# Patient Record
Sex: Female | Born: 1975 | Race: White | Hispanic: No | Marital: Married | Smoking: Former smoker
Health system: Southern US, Community
[De-identification: ages and names within clinical notes are randomized; demographics above are authoritative.]

## PROBLEM LIST (undated history)

## (undated) DIAGNOSIS — G709 Myoneural disorder, unspecified: Secondary | ICD-10-CM

## (undated) HISTORY — PX: OTHER SURGICAL HISTORY: SHX169

---

## 2009-06-05 ENCOUNTER — Emergency Department (HOSPITAL_COMMUNITY): Admission: EM | Admit: 2009-06-05 | Discharge: 2009-06-05 | Payer: Self-pay | Admitting: Emergency Medicine

## 2009-07-29 ENCOUNTER — Emergency Department (HOSPITAL_COMMUNITY): Admission: EM | Admit: 2009-07-29 | Discharge: 2009-07-29 | Payer: Self-pay | Admitting: Emergency Medicine

## 2012-08-10 ENCOUNTER — Inpatient Hospital Stay (HOSPITAL_COMMUNITY)
Admission: AD | Admit: 2012-08-10 | Discharge: 2012-08-10 | Disposition: A | Discharge status: Home / Self Care | Payer: BC Managed Care – PPO | Source: Ambulatory Visit | Attending: Obstetrics and Gynecology | Admitting: Obstetrics and Gynecology

## 2012-08-10 ENCOUNTER — Encounter (HOSPITAL_COMMUNITY): Payer: Self-pay

## 2012-08-10 ENCOUNTER — Inpatient Hospital Stay (HOSPITAL_COMMUNITY): Payer: BC Managed Care – PPO

## 2012-08-10 DIAGNOSIS — F4310 Post-traumatic stress disorder, unspecified: Secondary | ICD-10-CM | POA: Diagnosis not present

## 2012-08-10 DIAGNOSIS — O209 Hemorrhage in early pregnancy, unspecified: Secondary | ICD-10-CM | POA: Diagnosis present

## 2012-08-10 DIAGNOSIS — IMO0002 Reserved for concepts with insufficient information to code with codable children: Secondary | ICD-10-CM | POA: Diagnosis present

## 2012-08-10 DIAGNOSIS — Z8659 Personal history of other mental and behavioral disorders: Secondary | ICD-10-CM

## 2012-08-10 DIAGNOSIS — O021 Missed abortion: Secondary | ICD-10-CM | POA: Insufficient documentation

## 2012-08-10 NOTE — MAU Provider Note (Signed)
History    37 yo G1P0 at 10 weeks by early [place] here for f/u US after calling office with c/o brown spotting and mild cramping. Known O+ type.  Negative cultures 07/13/12.  US--8 2/7 week IUFD  Findings reviewed with patient.  Options reviewed--observation, [employer], or D&E. Patient wishes to be scheduled later this week when partner is back in town. I will inform office of request and have them coordinate arrangements with patient. Support to patient for loss. Consulted with Dr. Normand Sloop.   Nigel Bridgeman CNM, [place] 08/10/2012 2:54 PM

## 2012-08-10 NOTE — MAU Note (Signed)
Patient states she started having lower abdominal cramping and brownish spotting this am. Cramping continues.

## 2012-08-10 NOTE — Discharge Instructions (Signed)
Miscarriage A miscarriage is the sudden loss of an unborn baby (fetus) before the 20th week of pregnancy. Most miscarriages happen in the first 3 months of pregnancy. Sometimes, it happens before a woman even knows she is pregnant. A miscarriage is also called a "spontaneous miscarriage" or "early pregnancy loss." Having a miscarriage can be an emotional experience. Talk with your caregiver about any questions you may have about miscarrying, the grieving process, and your future pregnancy plans. CAUSES   Problems with the fetal chromosomes that make it impossible for the baby to develop normally. Problems with the baby's genes or chromosomes are most often the result of errors that occur, by chance, as the embryo divides and grows. The problems are not inherited from the parents.  Infection of the cervix or uterus.   Hormone problems.   Problems with the cervix, such as having an incompetent cervix. This is when the tissue in the cervix is not strong enough to hold the pregnancy.   Problems with the uterus, such as an abnormally shaped uterus, uterine fibroids, or congenital abnormalities.   Certain medical conditions.   Smoking, drinking alcohol, or taking illegal drugs.   Trauma.  Often, the cause of a miscarriage is unknown.  SYMPTOMS   Vaginal bleeding or spotting, with or without cramps or pain.  Pain or cramping in the abdomen or lower back.  Passing fluid, tissue, or blood clots from the vagina. DIAGNOSIS  Your caregiver will perform a physical exam. You may also have an ultrasound to confirm the miscarriage. Blood or urine tests may also be ordered. TREATMENT   Sometimes, treatment is not necessary if you naturally pass all the fetal tissue that was in the uterus. If some of the fetus or placenta remains in the body (incomplete miscarriage), tissue left behind may become infected and must be removed. Usually, a dilation and curettage (D and C) procedure is performed.  During a D and C procedure, the cervix is widened (dilated) and any remaining fetal or placental tissue is gently removed from the uterus.  Antibiotic medicines are prescribed if there is an infection. Other medicines may be given to reduce the size of the uterus (contract) if there is a lot of bleeding.  If you have Rh negative blood and your baby was Rh positive, you will need a Rh immunoglobulin shot. This shot will protect any future baby from having Rh blood problems in future pregnancies. HOME CARE INSTRUCTIONS   Your caregiver may order bed rest or may allow you to continue light activity. Resume activity as directed by your caregiver.  Have someone help with home and family responsibilities during this time.   Keep track of the number of sanitary pads you use each day and how soaked (saturated) they are. Write down this information.   Do not use tampons. Do not douche or have sexual intercourse until approved by your caregiver.   Only take over-the-counter or prescription medicines for pain or discomfort as directed by your caregiver.   Do not take aspirin. Aspirin can cause bleeding.   Keep all follow-up appointments with your caregiver.   If you or your partner have problems with grieving, talk to your caregiver or seek counseling to help cope with the pregnancy loss. Allow enough time to grieve before trying to get pregnant again.  SEEK IMMEDIATE MEDICAL CARE IF:   You have severe cramps or pain in your back or abdomen.  You have a fever.  You pass large blood clots (walnut-sized   or larger) ortissue from your vagina. Save any tissue for your caregiver to inspect.   Your bleeding increases.   You have a thick, bad-smelling vaginal discharge.  You become lightheaded, weak, or you faint.   You have chills.  MAKE SURE YOU:  Understand these instructions.  Will watch your condition.  Will get help right away if you are not doing well or get  worse. Document Released: 07/01/2000 Document Revised: 07/07/2011 Document Reviewed: 02/24/2011 Exit[employer] Patient Information 2014 [employer].  

## 2012-08-11 DIAGNOSIS — IMO0002 Reserved for concepts with insufficient information to code with codable children: Secondary | ICD-10-CM | POA: Diagnosis present

## 2013-06-15 ENCOUNTER — Encounter (HOSPITAL_COMMUNITY): Payer: Self-pay | Admitting: *Deleted

## 2013-11-20 ENCOUNTER — Encounter (HOSPITAL_COMMUNITY): Payer: Self-pay | Admitting: *Deleted

## 2014-01-19 NOTE — L&D Delivery Note (Signed)
Vaginal Delivery Note The pt utilized an epidural as pain management.   Spontaneous rupture of membranes today, at 0530, clear.  GBS was positive, Ancef  Cervical dilation was complete at  0725.     Pushing with guidance began at  0803.   After 16 minutes of pushing the head, shoulders and the body of a viable female infant "Cammy BrochureXander" delivered spontaneously with maternal effort in the ROA position at [phone].   Loose [place] x 2, easily reduced. With vigorous tone and spontaneous cry, the infant was placed on moms abd.  After the umbilical cord was clamped it was cut by the FOB, then cord blood was obtained for evaluation.  Spontaneous delivery of a intact placenta with a 3 vessel cord via [place] at  [phone].   Episiotomy: None   The vulva, perineum, vaginal vault, rectum and cervix were inspected and revealed a 2nd degree vaginal, repaired using a 3-0 vicryl on a CT needle and a superficial bilateral labial hemostatic, not repaired.  20cc of 1% lidocaine.  Lidocaine was not used, the epidural was sufficient for the repair.   The rectum sphincter intact after the repair.   Patient tolerated repair well.   Postpartum pitocin as ordered.  Fundus firm, lochia minimum, bleeding under control. QBL 721, Pt hemodynamically stable.   Sponge, laps and needle count correct and verified with the primary care nurse.  Attending MD available at all times.    Routine postpartum orders   Mother unsure about method of contraception  Mom plans to breastfeed and bottlefeed  Infant to have in patient circumcision   Placenta to pathology: NO     Cord Gases sent to lab: NO Cord blood sent to lab: YES   APGARS:  8 at 1 minute and 9 at 5 minutes Weight:. 6lb 0oz     Both mom and baby were left in stable condition, baby skin to skin.      Angel Allison, CNM, MSN 11/18/2014. 9:33 AM

## 2014-04-12 LAB — OB RESULTS CONSOLE GC/CHLAMYDIA
Chlamydia: NEGATIVE
Gonorrhea: NEGATIVE

## 2014-04-12 LAB — OB RESULTS CONSOLE RPR: RPR: NONREACTIVE

## 2014-04-12 LAB — OB RESULTS CONSOLE HEPATITIS B SURFACE ANTIGEN: HEP B S AG: NEGATIVE

## 2014-04-12 LAB — OB RESULTS CONSOLE HIV ANTIBODY (ROUTINE TESTING): HIV: NONREACTIVE

## 2014-04-12 LAB — OB RESULTS CONSOLE RUBELLA ANTIBODY, IGM: RUBELLA: IMMUNE

## 2014-11-15 ENCOUNTER — Inpatient Hospital Stay (HOSPITAL_COMMUNITY)
Admission: AD | Admit: 2014-11-15 | Discharge: 2014-11-20 | DRG: 775 | Disposition: A | Discharge status: Home / Self Care | Payer: BLUE CROSS/BLUE SHIELD | Source: Ambulatory Visit | Attending: Obstetrics & Gynecology | Admitting: Obstetrics & Gynecology

## 2014-11-15 ENCOUNTER — Encounter (HOSPITAL_COMMUNITY): Payer: Self-pay | Admitting: *Deleted

## 2014-11-15 DIAGNOSIS — O1490 Unspecified pre-eclampsia, unspecified trimester: Secondary | ICD-10-CM | POA: Diagnosis present

## 2014-11-15 DIAGNOSIS — Z3A36 36 weeks gestation of pregnancy: Secondary | ICD-10-CM

## 2014-11-15 DIAGNOSIS — O99824 Streptococcus B carrier state complicating childbirth: Secondary | ICD-10-CM | POA: Diagnosis present

## 2014-11-15 DIAGNOSIS — Z3483 Encounter for supervision of other normal pregnancy, third trimester: Secondary | ICD-10-CM | POA: Diagnosis present

## 2014-11-15 DIAGNOSIS — O1494 Unspecified pre-eclampsia, complicating childbirth: Secondary | ICD-10-CM | POA: Diagnosis present

## 2014-11-15 HISTORY — DX: Myoneural disorder, unspecified: G70.9

## 2014-11-15 LAB — COMPREHENSIVE METABOLIC PANEL
ALK PHOS: 131 U/L — AB (ref 38–126)
ALT: 11 U/L — ABNORMAL LOW (ref 14–54)
ANION GAP: 6 (ref 5–15)
AST: 15 U/L (ref 15–41)
Albumin: 2.3 g/dL — ABNORMAL LOW (ref 3.5–5.0)
BILIRUBIN TOTAL: 0.4 mg/dL (ref 0.3–1.2)
BUN: 10 mg/dL (ref 6–20)
CALCIUM: 8.5 mg/dL — AB (ref 8.9–10.3)
CO2: 21 mmol/L — ABNORMAL LOW (ref 22–32)
Chloride: 109 mmol/L (ref 101–111)
Creatinine, Ser: 0.62 mg/dL (ref 0.44–1.00)
GFR calc non Af Amer: >60 mL/min (ref >60)
Glucose, Bld: 81 mg/dL (ref 65–99)
POTASSIUM: 4.2 mmol/L (ref 3.5–5.1)
Sodium: 136 mmol/L (ref 135–145)
TOTAL PROTEIN: 5.8 g/dL — AB (ref 6.5–8.1)

## 2014-11-15 LAB — CBC
HEMATOCRIT: 30.9 % — AB (ref 36.0–46.0)
HEMOGLOBIN: 10.4 g/dL — AB (ref 12.0–15.0)
MCH: 29.5 pg (ref 26.0–34.0)
MCHC: 33.7 g/dL (ref 30.0–36.0)
MCV: 87.5 fL (ref 78.0–100.0)
Platelets: 298 10*3/uL (ref 150–400)
RBC: 3.53 MIL/uL — ABNORMAL LOW (ref 3.87–5.11)
RDW: 13.7 % (ref 11.5–15.5)
WBC: 15.4 10*3/uL — ABNORMAL HIGH (ref 4.0–10.5)

## 2014-11-15 LAB — PROTEIN / CREATININE RATIO, URINE
CREATININE, URINE: 44 mg/dL
Protein Creatinine Ratio: 1.39 mg/mg{Cre} — ABNORMAL HIGH (ref 0.00–0.15)
Total Protein, Urine: 61 mg/dL

## 2014-11-15 LAB — LACTATE DEHYDROGENASE: LDH: 158 U/L (ref 98–192)

## 2014-11-15 LAB — TYPE AND SCREEN
ABO/RH(D): O POS
Antibody Screen: NEGATIVE

## 2014-11-15 LAB — URIC ACID: URIC ACID, SERUM: 4.8 mg/dL (ref 2.3–6.6)

## 2014-11-15 MED ORDER — FAMOTIDINE 20 MG PO TABS
20.0000 mg | ORAL_TABLET | Freq: Every day | ORAL | Status: DC
  Administered 2014-11-16: 20 mg via ORAL
  Filled 2014-11-15: qty 1

## 2014-11-15 MED ORDER — DOCUSATE SODIUM 100 MG PO CAPS
100.0000 mg | ORAL_CAPSULE | Freq: Every day | ORAL | Status: DC
  Administered 2014-11-16 – 2014-11-17 (×2): 100 mg via ORAL
  Filled 2014-11-15 (×2): qty 1

## 2014-11-15 MED ORDER — ZOLPIDEM TARTRATE 5 MG PO TABS
5.0000 mg | ORAL_TABLET | Freq: Every evening | ORAL | Status: DC | PRN
  Administered 2014-11-16: 5 mg via ORAL
  Filled 2014-11-15: qty 1

## 2014-11-15 MED ORDER — PRENATAL MULTIVITAMIN CH
1.0000 | ORAL_TABLET | Freq: Every day | ORAL | Status: DC
Start: 2014-11-16 — End: 2014-11-17
  Administered 2014-11-16 – 2014-11-17 (×2): 1 via ORAL
  Filled 2014-11-15 (×2): qty 1

## 2014-11-15 MED ORDER — LABETALOL HCL 5 MG/ML IV SOLN: 20.0000 mg | INTRAVENOUS | Status: DC | PRN

## 2014-11-15 MED ORDER — HYDRALAZINE HCL 20 MG/ML IJ SOLN: 10.0000 mg | Freq: Once | INTRAMUSCULAR | Status: DC | PRN

## 2014-11-15 MED ORDER — BETAMETHASONE SOD PHOS & ACET 6 (3-3) MG/ML IJ SUSP
12.0000 mg | INTRAMUSCULAR | Status: AC
Start: 2014-11-15 — End: 2014-11-16
  Administered 2014-11-15 – 2014-11-16 (×2): 12 mg via INTRAMUSCULAR
  Filled 2014-11-15 (×2): qty 2

## 2014-11-15 MED ORDER — CALCIUM CARBONATE ANTACID 500 MG PO CHEW
2.0000 | CHEWABLE_TABLET | ORAL | Status: DC | PRN
  Administered 2014-11-16: 400 mg via ORAL
  Filled 2014-11-15: qty 2

## 2014-11-15 MED ORDER — ACETAMINOPHEN 325 MG PO TABS
650.0000 mg | ORAL_TABLET | ORAL | Status: DC | PRN
  Administered 2014-11-16 – 2014-11-17 (×2): 650 mg via ORAL
  Filled 2014-11-15 (×2): qty 2

## 2014-11-15 MED ORDER — FERROUS SULFATE 325 (65 FE) MG PO TABS
325.0000 mg | ORAL_TABLET | Freq: Every day | ORAL | Status: DC
  Administered 2014-11-16 – 2014-11-17 (×2): 325 mg via ORAL
  Filled 2014-11-15 (×2): qty 1

## 2014-11-15 NOTE — MAU Note (Signed)
PT  SAYS  SHE WAS  AT   OFFICE  TODAY   -  SAW   DR  Sallye OberKULWA   .   VE  -  1  CM.   BP  IN   OFFICE  144/97   AND  HAD  PROTEIN  IN URINE.   AND   FEET  SWOLLEN  .  NO H/A,   NO BLURRED  VISION, NO EPIGASTRIC  PAIN.       HAS  LOWER CRAMPING  - STARTED  YESTERDAY.

## 2014-11-15 NOTE — MAU Note (Signed)
Urine sent to lab 
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
## 2014-11-16 ENCOUNTER — Inpatient Hospital Stay (HOSPITAL_COMMUNITY): Payer: BLUE CROSS/BLUE SHIELD

## 2014-11-16 LAB — CBC
HCT: 31.4 % — ABNORMAL LOW (ref 36.0–46.0)
Hemoglobin: 10.6 g/dL — ABNORMAL LOW (ref 12.0–15.0)
MCH: 29.9 pg (ref 26.0–34.0)
MCHC: 33.8 g/dL (ref 30.0–36.0)
MCV: 88.5 fL (ref 78.0–100.0)
PLATELETS: 322 10*3/uL (ref 150–400)
RBC: 3.55 MIL/uL — ABNORMAL LOW (ref 3.87–5.11)
RDW: 13.8 % (ref 11.5–15.5)
WBC: 16.3 10*3/uL — AB (ref 4.0–10.5)

## 2014-11-16 LAB — COMPREHENSIVE METABOLIC PANEL
ALK PHOS: 115 U/L (ref 38–126)
ALT: 12 U/L — AB (ref 14–54)
AST: 14 U/L — AB (ref 15–41)
Albumin: 2.4 g/dL — ABNORMAL LOW (ref 3.5–5.0)
Anion gap: 5 (ref 5–15)
BUN: 11 mg/dL (ref 6–20)
CHLORIDE: 109 mmol/L (ref 101–111)
CO2: 21 mmol/L — AB (ref 22–32)
CREATININE: 0.7 mg/dL (ref 0.44–1.00)
Calcium: 8.3 mg/dL — ABNORMAL LOW (ref 8.9–10.3)
GFR calc Af Amer: >60 mL/min (ref >60)
Glucose, Bld: 114 mg/dL — ABNORMAL HIGH (ref 65–99)
Potassium: 4.2 mmol/L (ref 3.5–5.1)
Sodium: 135 mmol/L (ref 135–145)
Total Bilirubin: 0.3 mg/dL (ref 0.3–1.2)
Total Protein: 6.3 g/dL — ABNORMAL LOW (ref 6.5–8.1)

## 2014-11-16 LAB — RAPID HIV SCREEN (HIV 1/2 AB+AG)
HIV 1/2 Antibodies: NONREACTIVE
HIV-1 P24 Antigen - HIV24: NONREACTIVE

## 2014-11-16 LAB — RPR: RPR: NONREACTIVE

## 2014-11-16 LAB — URIC ACID: Uric Acid, Serum: 5.2 mg/dL (ref 2.3–6.6)

## 2014-11-16 LAB — ABO/RH: ABO/RH(D): O POS

## 2014-11-16 LAB — LACTATE DEHYDROGENASE: LDH: 148 U/L (ref 98–192)

## 2014-11-16 MED ORDER — PANTOPRAZOLE SODIUM 40 MG PO TBEC
40.0000 mg | DELAYED_RELEASE_TABLET | Freq: Two times a day (BID) | ORAL | Status: DC
  Administered 2014-11-17: 40 mg via ORAL
  Filled 2014-11-16: qty 1

## 2014-11-16 MED ORDER — PANTOPRAZOLE SODIUM 40 MG IV SOLR
40.0000 mg | Freq: Once | INTRAVENOUS | Status: AC
  Administered 2014-11-16: 40 mg via INTRAVENOUS
  Filled 2014-11-16: qty 40

## 2014-11-16 NOTE — Plan of Care (Signed)
Problem: Consults Goal: Birthing Suites Patient Information Press F2 to bring up selections list  Outcome: Completed/Met Date Met:  11/16/14  Pt < [redacted] weeks EGA

## 2014-11-16 NOTE — Progress Notes (Signed)
Vista MinkMarlene Stegenga [id number]  Subjective: Nurse call reports patient with c/o continued heartburn.  In room to assess.  Patient sitting upright in bed eating hamburger, baked potato, and drinking coffee.  Patient reports continued heartburn throughout the pregnancy and was taking 75mg  Zantac BID, Maalox BID, and tums prn.  Patient denies LOF, VB, and Ctx and reports fetal movement.  Objective:  Filed Vitals:   11/16/14 0800 11/16/14 1147 11/16/14 1600 11/16/14 1941  BP: 123/72 131/78 149/89 143/88  Pulse: 98 104 98 96  Temp: 98.4 F (36.9 C) 98.4 F (36.9 C) 98.1 F (36.7 C) 98.5 F (36.9 C)  TempSrc: Oral Oral Oral Oral  Resp: 18 18 18 18   Height:      Weight:        FHR: 125bpm, Mod Var, -Decels, +Accels UC: None Graphed  Assessment: IUP at 3435w2d Cat I FT Heartburn  Plan: -D/C pepcid -Give one dose protonix 40mg  IV -Protonix 40mg  BIDAC daily starting tomorrow -Encouraged to make less acidic, low fat diet choices -No other questions or concerns -Continue other mgmt as ordered  Sabas SousJ. Allyiah Gartner, CNM 11/16/2014 9:10 PM
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
## 2014-11-16 NOTE — Plan of Care (Signed)
Problem: Phase II Progression Outcomes Goal: Labs/tests as ordered Labs/tests as ordered (Magnesium level, CBG's, CBC, CMET, 24 hr Urine, Amniocentesis, Ultrasound, Other)  Outcome: Progressing Patient getting daily labs

## 2014-11-16 NOTE — Progress Notes (Signed)
MFM consult, Staff Note:  I reviewed hypertension and gestational hypertension as a cause of uteroplacental insufficiency, with increased risk of IUGR, oligohydramnios, and stillbirth. I told her that her preeclampsia also places her at increased risk for severe eclampsia, describing the triad of increased blood pressure, proteinuria, and abnormal edema that she currently exhibits to establish her diagnosis. Lastly, hypertension (severe range) increases the risk of placental abruption and maternal stroke/eclampsia, especially in the setting of preeclampsia at 36 weeks and 2 days.  I reviewed the essential tenets in the most recent guidelines for management of hypertension in pregnancy in accordance with the American College of Obstetrics and Gynecology expert opinion. We talked about the medical treatment of hypertension in pregnancy. I outlined the different classes of medications, emphasizing that angiotensin enzyme inhibitors and angoitensin receptor blockers are contraindicated, and diuretics are relatively contraindicated. I told her that beta-blockers and calcium channel blockers are commonly used to treat hypertension in pregnancy, and that both are felt to be safe for use in pregnancy.  Given that she has several systolic blood pressures that are approaching severe range at 158, 158, and 155 as well as a singular episode of severe systolic BP of 164, she is not a good candidate for outpatient surveillance.  If she has one more severe range BP onset of laboratory abnormality (eg, elevated LFTs or Cr, etc), onset of oliguria (<53000mL urine/24 hrs), onset of neurologic symptoms, she should get MgSO4 for seizure prophylaxis and induction started.  Regardless, I agree with admission to hospital, administration of antenatal corticosteroids and delivery by 37 weeks (ie, induction may be started anytime between 36-37 weeks for preeclampsia).    Summary of Recommendations: 1. Consider continued inpatient  management for preeclampsia (not yet clearly demonstrating severe features); 2. Move toward delivery sometime between completion of antenatal corticosteroids and 37 weeks noting I anticipate prolonged induction given unfavorable Bishop score, obesity, and excessive (55lb) weight gain this pregnancy.  My personal bias would be to start the induction upon completion of antenatal corticosteroids (ie, 2nd dose due tonight so cervical ripening would begin 24 hours later at 6965w3d tomorrow night). 3. Would assess this patient clinically as inpatient to detect evidence of either maternal or fetal deterioration, noting that expectant management would not be recommended if deterioration of either mom or fetus became apparent. 4. Given that this mom is borderline severe and in my opinion at risk for eclamptic seizure intrapartum/immediately postpartum, I would consider Magnesium sulfate during induction/intrapartum/postpartum, noting this remains contingent upon a reassuring fetal testing and stable maternal condition without clear onset of severe features in the interim (ie, clear evidence of severe feature warrants immediate administration of MgSO4 and moving toward delivery rather that waiting until 37 weeks).  I spent in excess of 40 minutes reviewing and evaluating this patient's case with greater than 50% of this time in face to face discussion.   Page with questions. Merideth AbbeyJ. M. Denney, MD, MS, Evern CoreFACOG

## 2014-11-16 NOTE — Consult Note (Signed)
Angel NoraJeffrey M Roshad Hack, MD Physician Signed Maternal-Fetal Medicine Progress Notes 11/16/2014 11:51 AM    Expand All Collapse All   MFM consult, Staff Note:  I reviewed hypertension and gestational hypertension as a cause of uteroplacental insufficiency, with increased risk of IUGR, oligohydramnios, and stillbirth. I told her that her preeclampsia also places her at increased risk for severe eclampsia, describing the triad of increased blood pressure, proteinuria, and abnormal edema that she currently exhibits to establish her diagnosis. Lastly, hypertension (severe range) increases the risk of placental abruption and maternal stroke/eclampsia, especially in the setting of preeclampsia at 36 weeks and 2 days.  I reviewed the essential tenets in the most recent guidelines for management of hypertension in pregnancy in accordance with the American College of Obstetrics and Gynecology expert opinion. We talked about the medical treatment of hypertension in pregnancy. I outlined the different classes of medications, emphasizing that angiotensin enzyme inhibitors and angoitensin receptor blockers are contraindicated, and diuretics are relatively contraindicated. I told her that beta-blockers and calcium channel blockers are commonly used to treat hypertension in pregnancy, and that both are felt to be safe for use in pregnancy. Given that she has several systolic blood pressures that are approaching severe range at 158, 158, and 155 as well as a singular episode of severe systolic BP of 164, she is not a good candidate for outpatient surveillance. If she has one more severe range BP onset of laboratory abnormality (eg, elevated LFTs or Cr, etc), onset of oliguria (<57800mL urine/24 hrs), onset of neurologic symptoms, she should get MgSO4 for seizure prophylaxis and induction started.  Regardless, I agree with admission to hospital, administration of antenatal corticosteroids and delivery by 37 weeks (ie, induction may  be started anytime between 36-37 weeks for preeclampsia).   Summary of Recommendations: 1. Consider continued inpatient management for preeclampsia (not yet clearly demonstrating severe features); 2. Move toward delivery sometime between completion of antenatal corticosteroids and 37 weeks noting I anticipate prolonged induction given unfavorable Bishop score, obesity, and excessive (55lb) weight gain this pregnancy. My personal bias would be to start the induction upon completion of antenatal corticosteroids (ie, 2nd dose due tonight so cervical ripening would begin 24 hours later at 5671w3d tomorrow night). 3. Would assess this patient clinically as inpatient to detect evidence of either maternal or fetal deterioration, noting that expectant management would not be recommended if deterioration of either mom or fetus became apparent. 4. Given that this mom is borderline severe and in my opinion at risk for eclamptic seizure intrapartum/immediately postpartum, I would consider Magnesium sulfate during induction/intrapartum/postpartum, noting this remains contingent upon a reassuring fetal testing and stable maternal condition without clear onset of severe features in the interim (ie, clear evidence of severe feature warrants immediate administration of MgSO4 and moving toward delivery rather that waiting until 37 weeks).  I spent in excess of 40 minutes reviewing and evaluating this patient's case with greater than 50% of this time in face to face discussion.   Page with questions. Merideth AbbeyJ. M. Treyton Slimp, MD, MS, Evern CoreFACOG

## 2014-11-17 ENCOUNTER — Inpatient Hospital Stay (HOSPITAL_COMMUNITY): Payer: BLUE CROSS/BLUE SHIELD

## 2014-11-17 LAB — LACTATE DEHYDROGENASE: LDH: 173 U/L (ref 98–192)

## 2014-11-17 LAB — CBC
HCT: 31 % — ABNORMAL LOW (ref 36.0–46.0)
HEMOGLOBIN: 10.2 g/dL — AB (ref 12.0–15.0)
MCH: 29.5 pg (ref 26.0–34.0)
MCHC: 32.9 g/dL (ref 30.0–36.0)
MCV: 89.6 fL (ref 78.0–100.0)
PLATELETS: 311 10*3/uL (ref 150–400)
RBC: 3.46 MIL/uL — AB (ref 3.87–5.11)
RDW: 13.9 % (ref 11.5–15.5)
WBC: 19.9 10*3/uL — AB (ref 4.0–10.5)

## 2014-11-17 LAB — COMPREHENSIVE METABOLIC PANEL
ALBUMIN: 2.3 g/dL — AB (ref 3.5–5.0)
ALK PHOS: 119 U/L (ref 38–126)
ALT: 11 U/L — AB (ref 14–54)
AST: 17 U/L (ref 15–41)
Anion gap: 4 — ABNORMAL LOW (ref 5–15)
BUN: 15 mg/dL (ref 6–20)
CALCIUM: 9 mg/dL (ref 8.9–10.3)
CHLORIDE: 109 mmol/L (ref 101–111)
CO2: 22 mmol/L (ref 22–32)
CREATININE: 0.68 mg/dL (ref 0.44–1.00)
GFR calc non Af Amer: >60 mL/min (ref >60)
GLUCOSE: 116 mg/dL — AB (ref 65–99)
Potassium: 4.7 mmol/L (ref 3.5–5.1)
SODIUM: 135 mmol/L (ref 135–145)
Total Bilirubin: 0.4 mg/dL (ref 0.3–1.2)
Total Protein: 6 g/dL — ABNORMAL LOW (ref 6.5–8.1)

## 2014-11-17 LAB — OB RESULTS CONSOLE GBS: GBS: POSITIVE

## 2014-11-17 LAB — GROUP B STREP BY PCR: Group B strep by PCR: POSITIVE — AB

## 2014-11-17 LAB — URIC ACID: URIC ACID, SERUM: 5.4 mg/dL (ref 2.3–6.6)

## 2014-11-17 MED ORDER — CITRIC ACID-SODIUM CITRATE 334-500 MG/5ML PO SOLN: 30.0000 mL | ORAL | Status: DC | PRN

## 2014-11-17 MED ORDER — LIDOCAINE HCL (PF) 1 % IJ SOLN
30.0000 mL | INTRAMUSCULAR | Status: AC | PRN
  Administered 2014-11-18: 30 mL via SUBCUTANEOUS
  Filled 2014-11-17 (×2): qty 30

## 2014-11-17 MED ORDER — ZOLPIDEM TARTRATE 5 MG PO TABS
5.0000 mg | ORAL_TABLET | Freq: Every evening | ORAL | Status: DC | PRN
  Administered 2014-11-18: 5 mg via ORAL
  Filled 2014-11-17: qty 1

## 2014-11-17 MED ORDER — ACETAMINOPHEN 325 MG PO TABS: 650.0000 mg | ORAL_TABLET | ORAL | Status: DC | PRN

## 2014-11-17 MED ORDER — NALBUPHINE HCL 10 MG/ML IJ SOLN
10.0000 mg | INTRAMUSCULAR | Status: DC | PRN
  Administered 2014-11-18: 10 mg via INTRAVENOUS
  Filled 2014-11-17: qty 1

## 2014-11-17 MED ORDER — CEFAZOLIN SODIUM 1-5 GM-% IV SOLN
1.0000 g | Freq: Three times a day (TID) | INTRAVENOUS | Status: DC
  Administered 2014-11-18 – 2014-11-19 (×4): 1 g via INTRAVENOUS
  Filled 2014-11-17 (×6): qty 50

## 2014-11-17 MED ORDER — FLEET ENEMA 7-19 GM/118ML RE ENEM: 1.0000 | ENEMA | RECTAL | Status: DC | PRN

## 2014-11-17 MED ORDER — MAGNESIUM SULFATE BOLUS VIA INFUSION
4.0000 g | Freq: Once | INTRAVENOUS | Status: AC
  Administered 2014-11-18: 4 g via INTRAVENOUS
  Filled 2014-11-17: qty 500

## 2014-11-17 MED ORDER — OXYCODONE-ACETAMINOPHEN 5-325 MG PO TABS
1.0000 | ORAL_TABLET | ORAL | Status: DC | PRN
Start: 2014-11-17 — End: 2014-11-18

## 2014-11-17 MED ORDER — OXYTOCIN BOLUS FROM INFUSION
500.0000 mL | INTRAVENOUS | Status: DC
  Administered 2014-11-18: 500 mL via INTRAVENOUS

## 2014-11-17 MED ORDER — LACTATED RINGERS IV SOLN
INTRAVENOUS | Status: DC
  Administered 2014-11-18: 01:00:00 via INTRAVENOUS
  Administered 2014-11-18: 500 mL/h via INTRAVENOUS

## 2014-11-17 MED ORDER — LACTATED RINGERS IV SOLN: 500.0000 mL | INTRAVENOUS | Status: DC | PRN

## 2014-11-17 MED ORDER — DIPHENHYDRAMINE HCL 50 MG/ML IJ SOLN: 12.5000 mg | Freq: Four times a day (QID) | INTRAMUSCULAR | Status: DC | PRN

## 2014-11-17 MED ORDER — HYDROXYZINE HCL 50 MG PO TABS
50.0000 mg | ORAL_TABLET | Freq: Four times a day (QID) | ORAL | Status: DC | PRN
  Filled 2014-11-17: qty 1

## 2014-11-17 MED ORDER — OXYCODONE-ACETAMINOPHEN 5-325 MG PO TABS: 2.0000 | ORAL_TABLET | ORAL | Status: DC | PRN

## 2014-11-17 MED ORDER — CEFAZOLIN SODIUM-DEXTROSE 2-3 GM-% IV SOLR
2.0000 g | Freq: Once | INTRAVENOUS | Status: AC
  Administered 2014-11-18: 2 g via INTRAVENOUS
  Filled 2014-11-17: qty 50

## 2014-11-17 MED ORDER — DIPHENHYDRAMINE HCL 25 MG PO CAPS: 25.0000 mg | ORAL_CAPSULE | Freq: Four times a day (QID) | ORAL | Status: DC | PRN

## 2014-11-17 MED ORDER — ONDANSETRON HCL 4 MG/2ML IJ SOLN: 4.0000 mg | Freq: Four times a day (QID) | INTRAMUSCULAR | Status: DC | PRN

## 2014-11-17 MED ORDER — LACTATED RINGERS IV SOLN
2.0000 g/h | INTRAVENOUS | Status: DC
  Administered 2014-11-18: 2 g/h via INTRAVENOUS
  Filled 2014-11-17 (×2): qty 80

## 2014-11-17 MED ORDER — OXYTOCIN 40 UNITS IN LACTATED RINGERS INFUSION - SIMPLE MED: 62.5000 mL/h | INTRAVENOUS | Status: DC

## 2014-11-17 NOTE — Progress Notes (Addendum)
In to introduce self to couple and review plan for tonight. Informed of limited u/s for presentation for starters, then [facility] likely with FB or Cytotec, depending on exam. Pt will also be started on Magnesium Sulfate w/ preE labs and Mag level 6 hrs after Magnesium begun. Strict I&Os to take effect at start of induction. R/B of Cytotec and FB reviewed. Discussed allergy to PCN - states has rxn to PCN and has taken Keflex in the past w/o issues. Reports PCN rxn as hives. Will proceed w/ Ancef for GBS pos prophylaxis per today's results. GBS completed in office and results pending that should be resulted tomorrow. Will f/u on that or pass along in am to oncoming team.   Sherre ScarletKimberly Raveen Wieseler, CNM, MS 11/17/14, 09:00 PM
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
## 2014-11-18 ENCOUNTER — Inpatient Hospital Stay (HOSPITAL_COMMUNITY): Payer: BLUE CROSS/BLUE SHIELD | Admitting: Anesthesiology

## 2014-11-18 ENCOUNTER — Encounter (HOSPITAL_COMMUNITY): Payer: Self-pay | Admitting: *Deleted

## 2014-11-18 LAB — COMPREHENSIVE METABOLIC PANEL
ALT: 11 U/L — ABNORMAL LOW (ref 14–54)
AST: 18 U/L (ref 15–41)
Albumin: 2.5 g/dL — ABNORMAL LOW (ref 3.5–5.0)
Alkaline Phosphatase: 126 U/L (ref 38–126)
Anion gap: 6 (ref 5–15)
BUN: 15 mg/dL (ref 6–20)
CHLORIDE: 109 mmol/L (ref 101–111)
CO2: 19 mmol/L — ABNORMAL LOW (ref 22–32)
Calcium: 7.9 mg/dL — ABNORMAL LOW (ref 8.9–10.3)
Creatinine, Ser: 0.83 mg/dL (ref 0.44–1.00)
Glucose, Bld: 105 mg/dL — ABNORMAL HIGH (ref 65–99)
POTASSIUM: 3.6 mmol/L (ref 3.5–5.1)
Sodium: 134 mmol/L — ABNORMAL LOW (ref 135–145)
Total Bilirubin: 0.3 mg/dL (ref 0.3–1.2)
Total Protein: 6.7 g/dL (ref 6.5–8.1)

## 2014-11-18 LAB — MAGNESIUM
MAGNESIUM: 4.6 mg/dL — AB (ref 1.7–2.4)
MAGNESIUM: 5.4 mg/dL — AB (ref 1.7–2.4)

## 2014-11-18 LAB — CBC
HCT: 31.5 % — ABNORMAL LOW (ref 36.0–46.0)
Hemoglobin: 10.3 g/dL — ABNORMAL LOW (ref 12.0–15.0)
MCH: 29 pg (ref 26.0–34.0)
MCHC: 32.7 g/dL (ref 30.0–36.0)
MCV: 88.7 fL (ref 78.0–100.0)
Platelets: 330 10*3/uL (ref 150–400)
RBC: 3.55 MIL/uL — ABNORMAL LOW (ref 3.87–5.11)
RDW: 13.9 % (ref 11.5–15.5)
WBC: 23.5 10*3/uL — AB (ref 4.0–10.5)

## 2014-11-18 LAB — CBC WITH DIFFERENTIAL/PLATELET
BASOS ABS: 0.1 10*3/uL (ref 0.0–0.1)
Basophils Relative: 0 %
EOS ABS: 0.1 10*3/uL (ref 0.0–0.7)
EOS PCT: 0 %
HEMATOCRIT: 24.1 % — AB (ref 36.0–46.0)
Hemoglobin: 8.1 g/dL — ABNORMAL LOW (ref 12.0–15.0)
LYMPHS ABS: 2.5 10*3/uL (ref 0.7–4.0)
Lymphocytes Relative: 10 %
MCH: 29.5 pg (ref 26.0–34.0)
MCHC: 33.2 g/dL (ref 30.0–36.0)
MCV: 88.9 fL (ref 78.0–100.0)
MONO ABS: 1.3 10*3/uL — AB (ref 0.1–1.0)
Monocytes Relative: 5 %
Neutro Abs: 22.7 10*3/uL — ABNORMAL HIGH (ref 1.7–7.7)
Neutrophils Relative %: 85 %
Platelets: 293 10*3/uL (ref 150–400)
RBC: 2.71 MIL/uL — AB (ref 3.87–5.11)
RDW: 13.9 % (ref 11.5–15.5)
WBC: 26.6 10*3/uL — ABNORMAL HIGH (ref 4.0–10.5)

## 2014-11-18 LAB — URIC ACID: URIC ACID, SERUM: 5.5 mg/dL (ref 2.3–6.6)

## 2014-11-18 LAB — TYPE AND SCREEN
ABO/RH(D): O POS
ANTIBODY SCREEN: NEGATIVE

## 2014-11-18 LAB — LACTATE DEHYDROGENASE: LDH: 182 U/L (ref 98–192)

## 2014-11-18 MED ORDER — FENTANYL 2.5 MCG/ML BUPIVACAINE 1/10 % EPIDURAL INFUSION (WH - ANES)
14.0000 mL/h | INTRAMUSCULAR | Status: DC | PRN
  Administered 2014-11-18: 14 mL/h via EPIDURAL
  Filled 2014-11-18: qty 125

## 2014-11-18 MED ORDER — TETANUS-DIPHTH-ACELL PERTUSSIS 5-2.5-18.5 LF-MCG/0.5 IM SUSP
0.5000 mL | Freq: Once | INTRAMUSCULAR | Status: DC
  Filled 2014-11-18: qty 0.5

## 2014-11-18 MED ORDER — PHENYLEPHRINE 40 MCG/ML (10ML) SYRINGE FOR IV PUSH (FOR BLOOD PRESSURE SUPPORT)
80.0000 ug | PREFILLED_SYRINGE | INTRAVENOUS | Status: DC | PRN
Start: 2014-11-18 — End: 2014-11-18
  Filled 2014-11-18: qty 2
  Filled 2014-11-18: qty 20

## 2014-11-18 MED ORDER — ZOLPIDEM TARTRATE 5 MG PO TABS
5.0000 mg | ORAL_TABLET | Freq: Every evening | ORAL | Status: DC | PRN
  Administered 2014-11-19: 5 mg via ORAL
  Filled 2014-11-18: qty 1

## 2014-11-18 MED ORDER — TERBUTALINE SULFATE 1 MG/ML IJ SOLN: 0.2500 mg | Freq: Once | INTRAMUSCULAR | Status: DC | PRN

## 2014-11-18 MED ORDER — WITCH HAZEL-GLYCERIN EX PADS
1.0000 "application " | MEDICATED_PAD | CUTANEOUS | Status: DC | PRN
  Administered 2014-11-20: 1 via TOPICAL

## 2014-11-18 MED ORDER — DOCUSATE SODIUM 100 MG PO CAPS
100.0000 mg | ORAL_CAPSULE | Freq: Two times a day (BID) | ORAL | Status: DC
  Administered 2014-11-18 – 2014-11-20 (×4): 100 mg via ORAL
  Filled 2014-11-18 (×4): qty 1

## 2014-11-18 MED ORDER — DIBUCAINE 1 % RE OINT
1.0000 "application " | TOPICAL_OINTMENT | RECTAL | Status: DC | PRN
  Administered 2014-11-20: 1 via RECTAL
  Filled 2014-11-18: qty 28

## 2014-11-18 MED ORDER — OXYCODONE-ACETAMINOPHEN 5-325 MG PO TABS: 2.0000 | ORAL_TABLET | ORAL | Status: DC | PRN

## 2014-11-18 MED ORDER — EPHEDRINE 5 MG/ML INJ
10.0000 mg | INTRAVENOUS | Status: DC | PRN
  Filled 2014-11-18: qty 2

## 2014-11-18 MED ORDER — IBUPROFEN 600 MG PO TABS
600.0000 mg | ORAL_TABLET | Freq: Four times a day (QID) | ORAL | Status: DC
  Administered 2014-11-18 – 2014-11-20 (×9): 600 mg via ORAL
  Filled 2014-11-18 (×9): qty 1

## 2014-11-18 MED ORDER — LIDOCAINE HCL (PF) 1 % IJ SOLN
INTRAMUSCULAR | Status: DC | PRN
  Administered 2014-11-18 (×2): 5 mL

## 2014-11-18 MED ORDER — ONDANSETRON HCL 4 MG/2ML IJ SOLN: 4.0000 mg | INTRAMUSCULAR | Status: DC | PRN

## 2014-11-18 MED ORDER — ACETAMINOPHEN 325 MG PO TABS: 650.0000 mg | ORAL_TABLET | ORAL | Status: DC | PRN

## 2014-11-18 MED ORDER — DIPHENHYDRAMINE HCL 50 MG/ML IJ SOLN: 12.5000 mg | INTRAMUSCULAR | Status: DC | PRN

## 2014-11-18 MED ORDER — MISOPROSTOL 25 MCG QUARTER TABLET
25.0000 ug | ORAL_TABLET | ORAL | Status: DC | PRN
  Administered 2014-11-18: 25 ug via VAGINAL
  Filled 2014-11-18 (×2): qty 0.25
  Filled 2014-11-18: qty 1

## 2014-11-18 MED ORDER — SIMETHICONE 80 MG PO CHEW
80.0000 mg | CHEWABLE_TABLET | ORAL | Status: DC | PRN
  Filled 2014-11-18: qty 1

## 2014-11-18 MED ORDER — OXYTOCIN 40 UNITS IN LACTATED RINGERS INFUSION - SIMPLE MED
1.0000 m[IU]/min | INTRAVENOUS | Status: DC
  Filled 2014-11-18: qty 1000

## 2014-11-18 MED ORDER — LACTATED RINGERS IV SOLN
INTRAVENOUS | Status: DC
  Administered 2014-11-19: 02:00:00 via INTRAVENOUS

## 2014-11-18 MED ORDER — OXYTOCIN 40 UNITS IN LACTATED RINGERS INFUSION - SIMPLE MED
1.0000 m[IU]/min | INTRAVENOUS | Status: DC
  Administered 2014-11-18: 1 m[IU]/min via INTRAVENOUS

## 2014-11-18 MED ORDER — FERROUS SULFATE 325 (65 FE) MG PO TABS
325.0000 mg | ORAL_TABLET | Freq: Two times a day (BID) | ORAL | Status: DC
  Administered 2014-11-18 – 2014-11-20 (×4): 325 mg via ORAL
  Filled 2014-11-18 (×4): qty 1

## 2014-11-18 MED ORDER — OXYCODONE-ACETAMINOPHEN 5-325 MG PO TABS: 1.0000 | ORAL_TABLET | ORAL | Status: DC | PRN

## 2014-11-18 MED ORDER — ONDANSETRON HCL 4 MG PO TABS: 4.0000 mg | ORAL_TABLET | ORAL | Status: DC | PRN

## 2014-11-18 MED ORDER — LANOLIN HYDROUS EX OINT: TOPICAL_OINTMENT | CUTANEOUS | Status: DC | PRN

## 2014-11-18 NOTE — Lactation Note (Signed)
This note was copied from the chart of [place] MinkMarlene Vu. Lactation Consultation Note Initial visit at 12 hours of age.  Mom is on mag in AICU.  RN set mom up with DEBP and she has already pumped about 8 mls.  Baby was syringe fed formula at the breast by RN prior to pumping.  Baby is getting baby and then was placed STS.  [facility] resources given and discussed.  Encouraged to feed with early cues on demand and supplement according to supplementation guidelines 8 times in 24 hours.  Encouraged mom and FOB to limit total feeding times to 30 minutes.   Mom is to post pump on preemie setting for 15 minutes and then work on hand expression to collect EBM and give to baby before supplementing with formula.   Early newborn behavior discussed.  Hand expression demonstrated with colostrum visible.  Mom to call for assist as needed.    Patient Name: [place] MinkMarlene Sawyer RUEAV'WToday's Date: 11/18/2014 Reason for consult: Initial assessment;Late preterm infant   Maternal Data Has patient been taught Hand Expression?: Yes Does the patient have breastfeeding experience prior to this delivery?: No  Feeding Feeding Type: Formula Length of feed: 8 min  LATCH Score/Interventions Latch: Grasps breast easily, tongue down, lips flanged, rhythmical sucking. Intervention(s): Skin to skin Intervention(s): Assist with latch;Adjust position  Audible Swallowing: A few with stimulation Intervention(s): Skin to skin Intervention(s): Skin to skin  Type of Nipple: Everted at rest and after stimulation  Comfort (Breast/Nipple): Soft / non-tender     Hold (Positioning): Assistance needed to correctly position infant at breast and maintain latch.  LATCH Score: 8  Lactation Tools Discussed/Used Pump Review: Setup, frequency, and cleaning;Milk Storage Initiated by:: JS Date initiated:: 11/18/14   Consult Status Consult Status: Follow-up Date: 11/19/14 Follow-up type: In-patient    Beverely RisenShoptaw, Arvella MerlesJana  Lynn 11/18/2014, 9:19 PM

## 2014-11-18 NOTE — Progress Notes (Signed)
Acknowledged order for social work consult regarding mother's hx of depression and PTSD  Referral is screened out by Clinical Social Worker because none of the following criteria appear to apply:  -History of anxiety/depression during this pregnancy, or of post-partum depression. - Diagnosis of anxiety and/or depression within last 3 years - History of depression due to pregnancy loss/loss of child or -MOB's symptoms are currently being treated with medication and/or therapy.  CSW completed chart review and is screening out referral at this time.   CSW consulted with MOB's RN who stated that history of depression and PTSD was many years ago.   Also informed that MOB is doing well and does not present with any symptoms at this time.  No acute social concerns related at this time.  Please contact the Clinical Social Worker if needs arise or upon MOB request.

## 2014-11-18 NOTE — Progress Notes (Signed)
  Subjective: Sleeping, yet easily aroused. Denies double or blurred vision, nausea, headache, flushing, warmth, somnolence, slurred speech, weakness, CP or SOB. Spouse at bedside.  Objective: BP 132/72 mmHg  Pulse 78  Temp(Src) 97.8 F (36.6 C) (Oral)  Resp 16  Ht 5\' 4"  (1.626 m)  Wt 92.534 kg (204 lb)  BMI 35.00 kg/m2  Today's Vitals   11/18/14 0200 11/18/14 0300 11/18/14 0400 11/18/14 0500  BP: 148/85 132/72    Pulse: 130 78    Temp:      TempSrc:      Resp: 16 16    Height:      Weight:      PainSc:   Asleep Asleep    Total I/O In: 420.4 [P.O.:60; I.V.:310.4; IV Piggyback:50] Out: 400 [Urine:400]  FHT: Category 1 UC:   none SVE: 4/80/-3 per RN Cytotec #1 placed at 0030 SROM, clear fluid at 0530  Assessment:  IOL due to preE GBS positive SROM  Plan: Continue current plan of care Repeat preE labs and Mag level 6 hrs after Magnesium started per Dr. Su Hiltoberts -- due at 06:30 AM Epidural prn   Sherre ScarletWILLIAMS, Alyss Granato CNM 11/18/2014, 5:32 AM

## 2014-11-18 NOTE — Anesthesia Procedure Notes (Addendum)
Epidural Patient location during procedure: OB  Staffing Anesthesiologist: Kannon Baum Performed by: anesthesiologist   Preanesthetic Checklist Completed: patient identified, site marked, surgical consent, pre-op evaluation, timeout performed, IV checked, risks and benefits discussed and monitors and equipment checked  Epidural Patient position: sitting Prep: DuraPrep Patient monitoring: heart rate, continuous pulse ox and blood pressure Approach: right paramedian Location: L4-L5 Injection technique: LOR saline  Needle:  Needle type: Tuohy  Needle gauge: 17 G Needle length: 9 cm and 9 Needle insertion depth: 6 cm Catheter type: closed end flexible Catheter size: 20 Guage Catheter at skin depth: 11 cm Test dose: negative  Assessment Events: blood not aspirated, injection not painful, no injection resistance, negative IV test and no paresthesia  Additional Notes Patient identified. Risks/Benefits/Options discussed with patient including but not limited to bleeding, infection, nerve damage, paralysis, failed block, incomplete pain control, headache, blood pressure changes, nausea, vomiting, reactions to medication both or allergic, itching and postpartum back pain. Confirmed with bedside nurse the patient's most recent platelet count. Confirmed with patient that they are not currently taking any anticoagulation, have any bleeding history or any family history of bleeding disorders. Patient expressed understanding and wished to proceed. All questions were answered. Sterile technique was used throughout the entire procedure. Please see nursing notes for vital signs. Test dose was given through epidural needle and negative prior to continuing to dose epidural or start infusion. Warning signs of high block given to the patient including shortness of breath, tingling/numbness in hands, complete motor block, or any concerning symptoms with instructions to call for help. Patient was given  instructions on fall risk and not to get out of bed. All questions and concerns addressed with instructions to call with any issues.   

## 2014-11-18 NOTE — Progress Notes (Signed)
Midwife notified of SROM while attempting to place cytotec.    Was unable to verify placement of medication.  Orders given to begin Pitocin at 1:1.

## 2014-11-18 NOTE — Progress Notes (Addendum)
[facility] F[facility] at bedside.  Subjective: Tired. Requests "something for sleep." Denies h/a, visual changes, epigastric pain or difficulty breathing.    Objective: BP 143/85 mmHg  Pulse 90  Temp(Src) 98.5 F (36.9 C) (Oral)  Resp 20  Ht 5\' 4"  (1.626 m)  Wt 92.534 kg (204 lb)  BMI 35.00 kg/m2     Today's Vitals   11/17/14 1600 11/17/14 2031 11/17/14 2145 11/17/14 2245  BP:  143/85    Pulse:  90    Temp:  98.5 F (36.9 C)    TempSrc:  Oral    Resp:  20    Height:      Weight:      PainSc: 0-No pain 0-No pain 0-No pain 0-No pain   Lungs: CTAB CV: RRR w/o M/R/G Abdomen: gravid, soft, NT Ext: Reflexes 3+ bilaterally, 2+ LE edema  FHT: Category 1 UC:   none SVE:   Dilation: 1 Effacement (%): Thick Station: -3 Exam by:: Thornell MuleK Lynden Carrithers, CNM Cvx: 1 cm/thick/-3/med/posterior Bishop score: 1  Assessment:  IUP, late preterm IOL due to preE Unfavorable cvx  GBS positive  Plan: In light of unfavorable cvx, will proceed w/ Cytotec. All questions answered to couple's satisfaction. Ancef for GBS positive status. R/B of Magnesium Sufate reviewed.    Angel Allison, Angel Allison CNM 11/18/2014, 12:12 AM

## 2014-11-18 NOTE — Plan of Care (Signed)
Problem: Phase I Progression Outcomes Goal: Voiding adequately Outcome: Progressing Patient educated to call for assistance and need to continue to measure voids.

## 2014-11-18 NOTE — Anesthesia Postprocedure Evaluation (Signed)
  Anesthesia Post-op Note  Patient: Angel Allison  Procedure(s) Performed: * No procedures listed *  Patient Location: PACU and Antenatal  Anesthesia Type:Epidural  Level of Consciousness: awake, alert  and oriented  Airway and Oxygen Therapy: Patient Spontanous Breathing  Post-op Pain: mild  Post-op Assessment: Post-op Vital signs reviewed              Post-op Vital Signs: Reviewed and stable  Last Vitals:  Filed Vitals:   11/18/14 1300  BP: 127/41  Pulse: 95  Temp: 36.8 C  Resp: 18    Complications: No apparent anesthesia complications

## 2014-11-18 NOTE — Anesthesia Preprocedure Evaluation (Signed)
Anesthesia Evaluation  Patient identified by MRN, date of birth, ID band Patient awake    Reviewed: Allergy & Precautions, H&P , NPO status , Patient's Chart, lab work & pertinent test results  History of Anesthesia Complications Negative for: history of anesthetic complications  Airway Mallampati: II  TM Distance: >3 FB Neck ROM: full    Dental no notable dental hx. (+) Teeth Intact   Pulmonary neg pulmonary ROS, former smoker,    Pulmonary exam normal breath sounds clear to auscultation       Cardiovascular hypertension, Normal cardiovascular exam Rhythm:regular Rate:Normal     Neuro/Psych negative neurological ROS  negative psych ROS   GI/Hepatic negative GI ROS, Neg liver ROS,   Endo/Other  negative endocrine ROS  Renal/GU negative Renal ROS  negative genitourinary   Musculoskeletal   Abdominal   Peds  Hematology negative hematology ROS (+)   Anesthesia Other Findings   Reproductive/Obstetrics (+) Pregnancy Pre eclampsia                             Anesthesia Physical Anesthesia Plan  ASA: II  Anesthesia Plan: Epidural   Post-op Pain Management:    Induction:   Airway Management Planned:   Additional Equipment:   Intra-op Plan:   Post-operative Plan:   Informed Consent: I have reviewed the patients History and Physical, chart, labs and discussed the procedure including the risks, benefits and alternatives for the proposed anesthesia with the patient or authorized representative who has indicated his/her understanding and acceptance.     Plan Discussed with:   Anesthesia Plan Comments:         Anesthesia Quick Evaluation

## 2014-11-18 NOTE — Progress Notes (Signed)
Patient: Angel Allison, Angel Allison  DOB:  1975-04-06  PPD: #  On Postpartum magnesium sulfate.   Subjective:   She denies any headaches, nausea,vomiting, chest pain, shortness of breath, abdominal or epigastric pain.   She has tolerated clears. Her incisional pain is well controlled. She has not ambulated yet while on magnesium sulfate. She reports feeling tired and sometimes seeing blurry vision.   Objective: I have reviewed patient's vitals and labs. Filed Vitals:   11/18/14 0946 11/18/14 1000 11/18/14 1015 11/18/14 1300  BP: 127/79 141/68 143/84 127/41  Pulse: 101 99 97 95  Temp:   97.9 F (36.6 C) 98.3 F (36.8 C)  TempSrc:   Oral Oral  Resp: 17 17 16 18   Height:      Weight:      SpO2:        General: alert, cooperative and no distress Resp: clear to auscultation bilaterally Cardio: regular rate and rhythm, S1, S2 normal, no murmur, click, rub or gallop GI: soft, non-tender; bowel sounds normal; no masses,  no organomegaly Extremities: extremities normal, atraumatic, no cyanosis or edema 2+ reflexes on patella bilaterally Peripad: pad saturated with blood (has not been changed for past 8 hrs): RN to change pad and monitor.  Fundus firm 2 FB above umbulicus.   CBC    Component Value Date/Time   WBC 26.6* 11/18/2014 1345   RBC 2.71* 11/18/2014 1345   HGB 8.1* 11/18/2014 1345   HCT 24.1* 11/18/2014 1345   PLT 293 11/18/2014 1345   MCV 88.9 11/18/2014 1345   MCH 29.5 11/18/2014 1345   MCHC 33.2 11/18/2014 1345   RDW 13.9 11/18/2014 1345   LYMPHSABS 2.5 11/18/2014 1345   MONOABS 1.3* 11/18/2014 1345   EOSABS 0.1 11/18/2014 1345   BASOSABS 0.1 11/18/2014 1345   CMP     Component Value Date/Time   NA 134* 11/18/2014 0638   K 3.6 11/18/2014 0638   CL 109 11/18/2014 0638   CO2 19* 11/18/2014 0638   GLUCOSE 105* 11/18/2014 0638   BUN 15 11/18/2014 0638   CREATININE 0.83 11/18/2014 0638   CALCIUM 7.9* 11/18/2014 0638   PROT 6.7 11/18/2014 0638   ALBUMIN 2.5* 11/18/2014  0638   AST 18 11/18/2014 0638   ALT 11* 11/18/2014 0638   ALKPHOS 126 11/18/2014 0638   BILITOT 0.3 11/18/2014 0638   GFRNONAA >60 11/18/2014 0638   GFRAA >60 11/18/2014 0638   11/18/14 @ 1455: Magnesium level 5.4  Assessment/Plan:   PPD # 0 on postpartum magnesium sulfate, stable BPs in mild range not requiring treatment Continue with postpartum magnesium prophylaxis until 10/31 at about 8:20 AM Reviewed with patient magnesium sulfate use postpartum and side effects as well as symptoms to alert nursing staff about. Continue with close observations with signs and symptoms of preeclampsia and magnesium toxicity.    LOS: 3 days    [facility] Center[facility]ULWA,Ahkeem Goede [facility] Center[facility]AKURU 11/18/2014, 2:43 PM

## 2014-11-19 ENCOUNTER — Encounter (HOSPITAL_COMMUNITY): Payer: Self-pay | Admitting: *Deleted

## 2014-11-19 LAB — COMPREHENSIVE METABOLIC PANEL
ALBUMIN: 2.2 g/dL — AB (ref 3.5–5.0)
ALK PHOS: 101 U/L (ref 38–126)
ALT: 12 U/L — AB (ref 14–54)
ANION GAP: 6 (ref 5–15)
AST: 23 U/L (ref 15–41)
BILIRUBIN TOTAL: 0.4 mg/dL (ref 0.3–1.2)
BUN: 13 mg/dL (ref 6–20)
CALCIUM: 6.7 mg/dL — AB (ref 8.9–10.3)
CO2: 23 mmol/L (ref 22–32)
CREATININE: 0.67 mg/dL (ref 0.44–1.00)
Chloride: 107 mmol/L (ref 101–111)
GFR calc Af Amer: >60 mL/min (ref >60)
GFR calc non Af Amer: >60 mL/min (ref >60)
GLUCOSE: 109 mg/dL — AB (ref 65–99)
Potassium: 3.7 mmol/L (ref 3.5–5.1)
Sodium: 136 mmol/L (ref 135–145)
TOTAL PROTEIN: 5.2 g/dL — AB (ref 6.5–8.1)

## 2014-11-19 LAB — CBC
HEMATOCRIT: 23.8 % — AB (ref 36.0–46.0)
Hemoglobin: 7.9 g/dL — ABNORMAL LOW (ref 12.0–15.0)
MCH: 29.7 pg (ref 26.0–34.0)
MCHC: 33.2 g/dL (ref 30.0–36.0)
MCV: 89.5 fL (ref 78.0–100.0)
PLATELETS: 288 10*3/uL (ref 150–400)
RBC: 2.66 MIL/uL — AB (ref 3.87–5.11)
RDW: 14.2 % (ref 11.5–15.5)
WBC: 19.1 10*3/uL — ABNORMAL HIGH (ref 4.0–10.5)

## 2014-11-19 LAB — LACTATE DEHYDROGENASE: LDH: 265 U/L — ABNORMAL HIGH (ref 98–192)

## 2014-11-19 LAB — URIC ACID: Uric Acid, Serum: 5.3 mg/dL (ref 2.3–6.6)

## 2014-11-19 LAB — MAGNESIUM: Magnesium: 5.6 mg/dL — ABNORMAL HIGH (ref 1.7–2.4)

## 2014-11-19 MED ORDER — SODIUM CHLORIDE 0.9 % IJ SOLN
3.0000 mL | Freq: Two times a day (BID) | INTRAMUSCULAR | Status: DC
  Administered 2014-11-19: 3 mL via INTRAVENOUS

## 2014-11-19 MED ORDER — BENZOCAINE-MENTHOL 20-0.5 % EX AERO
1.0000 "application " | INHALATION_SPRAY | Freq: Four times a day (QID) | CUTANEOUS | Status: DC | PRN
  Administered 2014-11-19 – 2014-11-20 (×2): 1 via TOPICAL
  Filled 2014-11-19 (×2): qty 56

## 2014-11-19 MED ORDER — MENTHOL 3 MG MT LOZG
1.0000 | LOZENGE | OROMUCOSAL | Status: DC | PRN
  Filled 2014-11-19: qty 9

## 2014-11-19 MED ORDER — SODIUM CHLORIDE 0.9 % IJ SOLN: 3.0000 mL | INTRAMUSCULAR | Status: DC | PRN

## 2014-11-19 NOTE — Progress Notes (Signed)
Angel Allison   Subjective: Post Partum Day 1 Vaginal delivery, 2 degree laceration Patient up ad lib, denies syncope or dizziness.  Report vision changes and a slight HA Reports consuming regular diet without issues and denies N/V No issues with urination and reports bleeding is appropriate  Feeding:  bottle Contraceptive plan:   Unsure FOB at the bedside  Objective: Temp:  [97.9 F (36.6 C)-98.5 F (36.9 C)] 97.9 F (36.6 C) (10/31 0803) Pulse Rate:  [84-116] 99 (10/31 0832) Resp:  [16-20] 18 (10/31 0803) BP: (127-171)/(41-103) 149/96 mmHg (10/31 0803) SpO2:  [90 %-99 %] 99 % (10/31 16100832)  Physical Exam:  General: alert and cooperative Ext: WNL, no significant  edema. No evidence of DVT seen on physical exam. Breast: Soft filling Lungs: CTAB Heart RRR without murmur  Abdomen:  Soft, fundus firm, lochia scant, + bowel sounds, non distended, non tender Lochia: appropriate Uterine Fundus: firm Laceration: healing well    Recent Labs  11/18/14 1345 11/19/14 0605  HGB 8.1* 7.9*  HCT 24.1* 23.8*    Assessment S/P Vaginal Delivery-Day 1 Stable  Normal Involution Circumcision: in patient  Plan: Continue current care Plan for discharge tomorrow Lactation support PIH labs Mag level labs   Aidan Caloca, CNM, MSN 11/19/2014, 9:06 AM

## 2014-11-19 NOTE — Progress Notes (Signed)
PROGRESS NOTE  I have reviewed the patient's vital signs, labs, and notes. I have examined the patient. I agree with the previous note from the Certified Nurse Midwife.  Discontinue magnesium. Transfusion discussed. R and B reviewed. Questions answered. Check orthostatics. Declined for now. Circumcision reviewed. Home on Nov 1.  Angel Allison, M.D. 11/19/2014

## 2014-11-19 NOTE — Lactation Note (Signed)
This note was copied from the chart of Angel Allison. Lactation Consultation Note Gave 22 cal. Formula for supplementing. Has LPI information sheet about supplementing after BF. Patient Name: Angel [place] MinkMarlene Bayman ZOXWR'UToday's Date: 11/19/2014     Maternal Data    Feeding Feeding Type: Breast Fed Length of feed: 10 min  LATCH Score/Interventions                      Lactation Tools Discussed/Used     Consult Status      Shaun Zuccaro G 11/19/2014, 8:17 AM

## 2014-11-19 NOTE — Lactation Note (Addendum)
This note was copied from the chart of Angel Allison Votaw. Lactation Consultation Note  Patient Name: Angel Allison Hildebrant XBJYN'WToday's Date: 11/19/2014 Reason for consult: Follow-up assessment  LPI 26 hours old. Parents report that baby in [address] [facility] for [facility]cision. Discussed how this may affect BF. Mom states that she is attempting to nurse baby every 3 hours. Discussed baby's weight loss of 5% during first 16 hours of life. Enc parents to nurse with cues, waking to feed by 3 hours if baby not cueing. Enc parents to supplement with EBM/formula according to supplementation guidelines--parents are using syringe and finger-feeding. Enc mom to post-pump after nursing for 15 minutes, followed by hand expression. Discussed how this process will get baby fed adequately and protect mom's milk supply. Parents state that they were a little confused earlier about feeding baby, but agree they are more clear now. Enc limiting total feeding time to 30 minutes. Mom states that she is getting lots of EBM and still has EBM at bedside. Referred parents to Baby and Me booklet for EBM storage guidelines.  Enc mom to call out for assistance with latching as needed. Mom states that she is in contact with her insurance company about DEBP, but will need a 2-week hospital rental. Parents given DEBP paperwork with review.   Maternal Data    Feeding    LATCH Score/Interventions                      Lactation Tools Discussed/Used     Consult Status Consult Status: Follow-up Date: 11/20/14 Follow-up type: In-patient    Geralynn OchsWILLIARD, Miral Hoopes 11/19/2014, 11:07 AM

## 2014-11-20 MED ORDER — OXYCODONE-ACETAMINOPHEN 5-325 MG PO TABS: 1.0000 | ORAL_TABLET | Freq: Four times a day (QID) | ORAL | Status: AC | PRN

## 2014-11-20 MED ORDER — IBUPROFEN 600 MG PO TABS: 600.0000 mg | ORAL_TABLET | Freq: Four times a day (QID) | ORAL | Status: AC | PRN

## 2014-11-20 NOTE — Lactation Note (Signed)
This note was copied from the chart of Angel Allison Eddie. Lactation Consultation Note  Patient Name: Angel Allison [place] UJWJX'BToday's Date: 11/20/2014 Reason for consult: Follow-up assessment;Hyperbilirubinemia;Infant weight loss;Infant < 6lbs;Late preterm infant  LC revisited mom to check a latch . Worked on positioning and depth at the breast , per mom comfortable , multiply swallows noted,  Increased with breast compressions. Baby released on his own. Parents have good understanding of plan.     Maternal Data Has patient been taught Hand Expression?: Yes  Feeding Feeding Type: Breast Fed Nipple Type: Slow - flow Length of feed: 17 min (multiply swallows , increased with breast compressions )  Dad plans to supplement after 17 mins of breast feeding.   LATCH Score/Interventions Latch: Grasps breast easily, tongue down, lips flanged, rhythmical sucking. Intervention(s): Skin to skin;Teach feeding cues;Waking techniques Intervention(s): Adjust position;Assist with latch;Breast massage;Breast compression  Audible Swallowing: Spontaneous and intermittent  Type of Nipple: Everted at rest and after stimulation  Comfort (Breast/Nipple): Soft / non-tender     Hold (Positioning): Assistance needed to correctly position infant at breast and maintain latch. Intervention(s): Breastfeeding basics reviewed;Support Pillows;Position options;Skin to skin  LATCH Score: 9  Lactation Tools Discussed/Used     Consult Status Consult Status: Follow-up Date: 11/21/14 Follow-up type: In-patient    Kathrin Greathouseorio, Nhung Danko Ann 11/20/2014, 10:10 AM

## 2014-11-20 NOTE — Lactation Note (Addendum)
This note was copied from the chart of Angel Vista MinkMarlene Cid. Lactation Consultation Note  Patient Name: Angel Allison ZOXWR'UToday's Date: 11/20/2014 Reason for consult: Follow-up assessment;Infant weight loss;Other (Comment);Hyperbilirubinemia;Infant < 6lbs;Late preterm infant (baby on Double Photo , sound asleep, last fed at 0700 , 7 % weight loss )  Per mom last pumped after 0700 feeding , LC checked bottle 4-5 ml EBM.  Since baby recently breast fed and supplemented afterwards and is sound asleep at this time unable to check latch.  LC recommended and encouraged mom to call at next feeding for feeding assessment.  LC reviewed potential feeding behavior for a late preterm infant , with elevated jaundice level, photo tx, and less than 6 pounds.  LC recommended to mom if latching and sluggish , and not nutritive to try an appetizer of EBM or formula , then back to breast , if still non nutritive to complete feeding with increasing supplement and post pump  For 10 -15 mins both breast . Mom  and dad [facility] a good [facility]. LC reassured mom and stressed the importance of consistent pumping to establish and protect milk supply.  LC spoke with Dr. Kathlene NovemberMcCormick and discharge will be held until tomorrow and baby will be a Baby pt.  LC reviewed sore nipple and engorgement prevention and tx.      Maternal Data    Feeding earlier feeding  Feeding Type: Bottle Fed - Formula Nipple Type: Slow - flow Length of feed: 15 min  LATCH Score/Interventions Latch: Grasps breast easily, tongue down, lips flanged, rhythmical sucking.  Audible Swallowing: A few with stimulation  Type of Nipple: Everted at rest and after stimulation  Comfort (Breast/Nipple): Soft / non-tender     Hold (Positioning): No assistance needed to correctly position infant at breast. Intervention(s): Breastfeeding basics reviewed (per mom pumped at 0700 feeding with EBM yield - 4-5 ml . enc mom to keep pumping  )  LATCH Score: 9  Lactation Tools Discussed/Used     Consult Status Consult Status: Follow-up Date: 11/20/14 Follow-up type: In-patient    Kathrin Greathouseorio, Fayrene Towner Ann 11/20/2014, 9:31 AM

## 2014-11-20 NOTE — Discharge Summary (Signed)
Obstetric Discharge Summary Reason for Admission: Preeclampsia and underwent induction of labor Prenatal Procedures: NST and ultrasound Intrapartum Procedures: spontaneous vaginal delivery Postpartum Procedures: Magnesium Sulfate Complications-Operative and Postpartum: none HEMOGLOBIN  Date Value Ref Range Status  11/19/2014 7.9* 12.0 - 15.0 g/dL Final   HCT  Date Value Ref Range Status  11/19/2014 23.8* 36.0 - 46.0 % Final    Physical Exam:  General: alert and no distress Lochia: appropriate Uterine Fundus: firm Incision: n/a DVT Evaluation: No evidence of DVT seen on physical exam.  Discharge Diagnoses: NSVD at 36 + wks s/p Mg for preeclampsia with stable BPs not on meds  Discharge Information: Date: 11/20/2014 Activity: unrestricted and pelvic rest Diet: routine Medications: Ibuprofen and Percocet Condition: improved Instructions: refer to practice specific booklet Discharge to: home but pt will room in tonight because baby is being treated with bili lights.   Newborn Data: Live born female  Birth Weight: 6 lb 0.7 oz (2740 g) APGAR: 9, 10  Home with mother once discharged  Angel Allison 11/20/2014, 4:05 PM

## 2014-11-21 ENCOUNTER — Ambulatory Visit: Payer: Self-pay

## 2014-11-21 NOTE — Lactation Note (Signed)
This note was copied from the chart of Angel Vista MinkMarlene Rentfrow. Lactation Consultation Note  Patient Name: Angel Allison ONGEX'BToday's Date: 11/21/2014 Reason for consult: Follow-up assessment;Infant < 6lbs;Late preterm infant;Pump rental   Follow up with mom, Dad, and 4275 hour old female infant. Infant to be D/C home today. Infant born at 36w 4day and is now 37 weeks CGA. Infant has BF 8 x in 24 hours for 10-30 minutes, Bottle fed x 7 with Neocare 22 cal formula 20-30 cc each feeding, 6 voids and 9 stools in last 24 hours. Infant with 6% weight loss since birth, He has gained in last 24 hours. Infant currently 5 pounds 10.5 ounces. Mom denies nipple pain. She reports that sometimes the infant latches well and sometimes he is difficult to latch due to fussiness, she reports she has to correct bottom lip often. Mom and infant both with recessed chin. Discussed pre pumping for a few minutes or giving a small amount EBM in bottle and then latch to breast if infant difficult to latch. Mom is pumping pc and received 30 cc EBM this morning. They rented a pump today. Plan is for mom to BF every 2-3 hours, awakening as necessary at 3 hours,  followed by supplementation of EBM/Formula followed by pumping for 15 minutes. Mom is aware to change to normal pumping pattern on pump once receives > 20 cc for 3 pumpings in a row. Engorgement prevention reviewed. Reviewed EBM Storage with family. Reviewed LPT infant protocol and need to continue pumping to supplement infant and to protect milk supply. Parents have LC brochure and are aware to call with questions/concerns. Infant has F/U Ped appt tomorrow with Dr. Dario GuardianPudlo. Parents voiced they had no questions at this point. Enc to call as needed.     Maternal Data Formula Feeding for Exclusion: No Does the patient have breastfeeding experience prior to this delivery?: No  Feeding Feeding Type: Bottle Fed - Breast Milk Nipple Type: Slow - flow Length of feed: 15 min  LATCH  Score/Interventions                      Lactation Tools Discussed/Used WIC Program: No Pump Review: Setup, frequency, and cleaning;Milk Storage   Consult Status Consult Status: Complete Date: 11/21/14 Follow-up type: Call as needed    Ed BlalockSharon S Newman Waren 11/21/2014, 11:39 AM

## 2014-12-05 ENCOUNTER — Encounter (INDEPENDENT_AMBULATORY_CARE_PROVIDER_SITE_OTHER): Payer: Self-pay

## 2016-08-31 IMAGING — US US OB LIMITED
1 series · 12 of 12 positions shown · non-contrast
Comparison: none

[Series 1: us ob limited · 12 acquisitions, 12 frames shown]
[im 1/12]
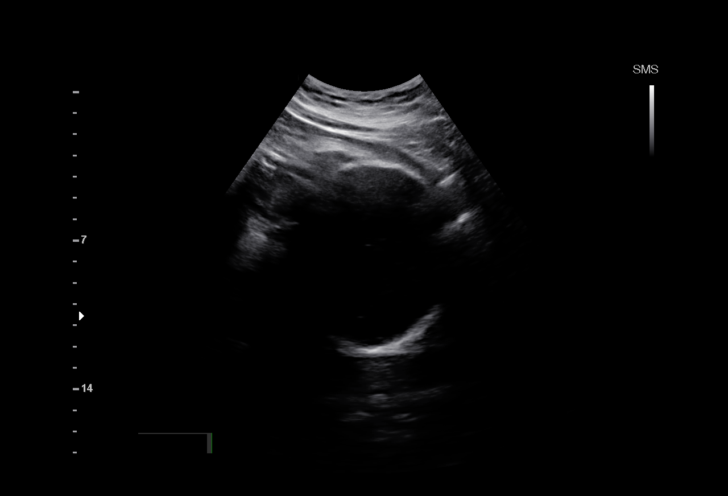
[im 2/12]
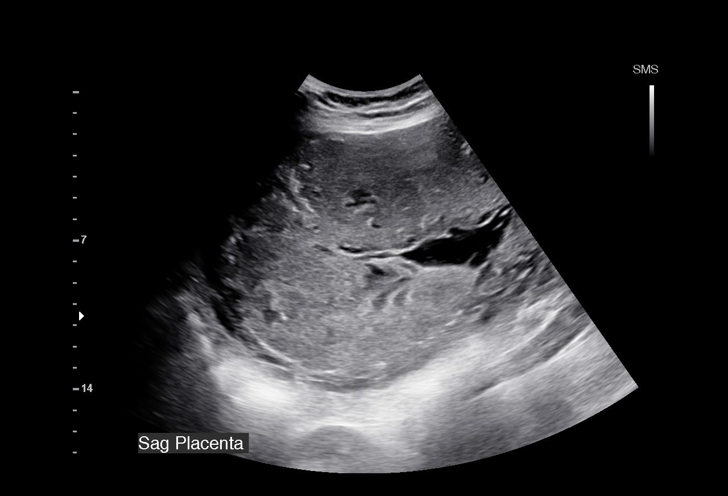
[im 3/12]
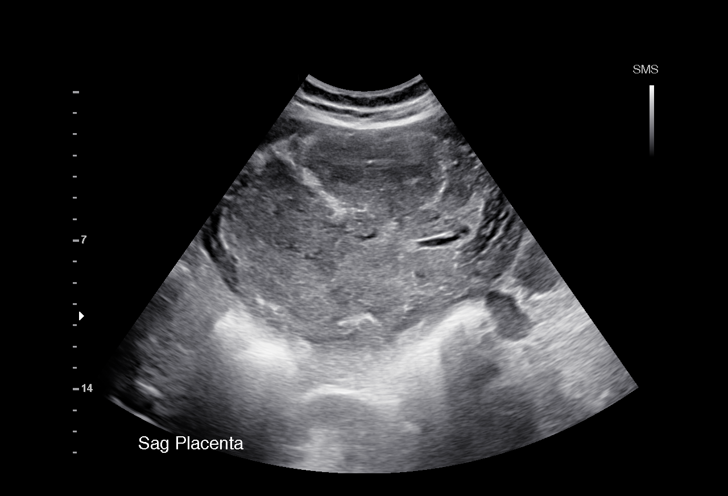
[im 4/12]
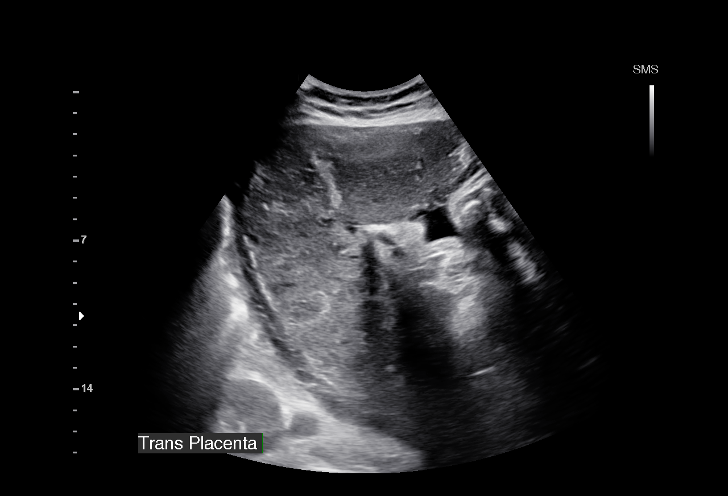
[im 5/12]
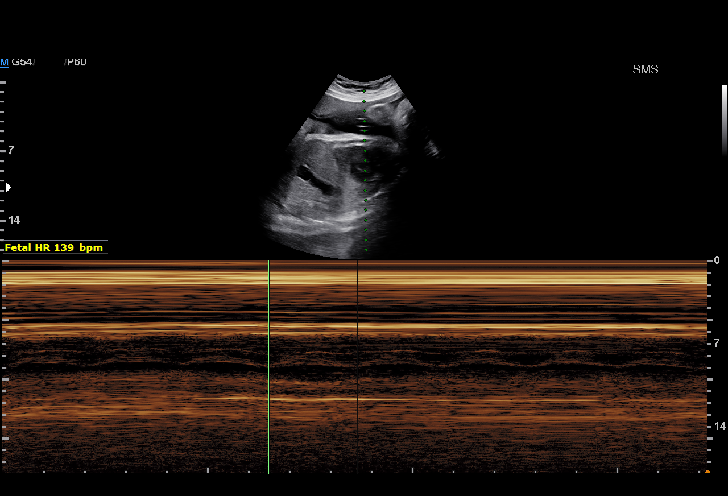
[im 6/12]
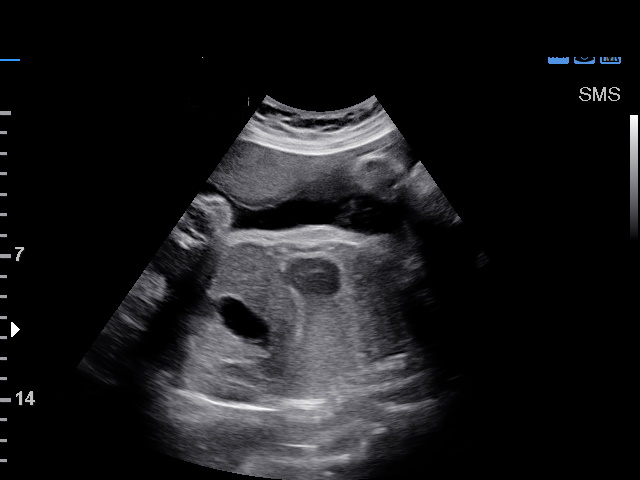
[im 7/12]
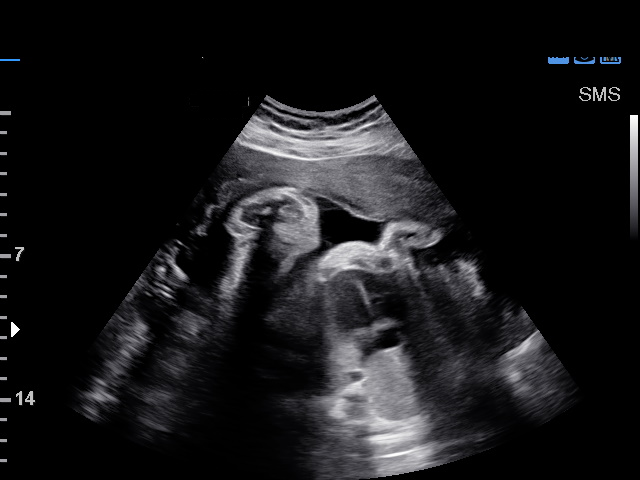
[im 8/12]
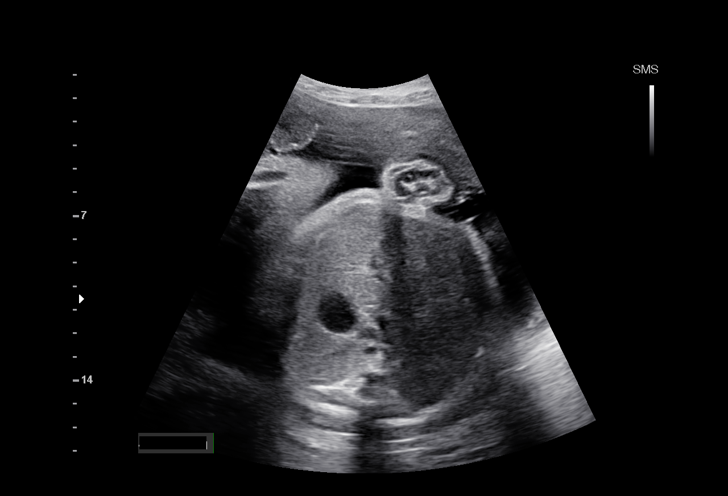
[im 9/12]
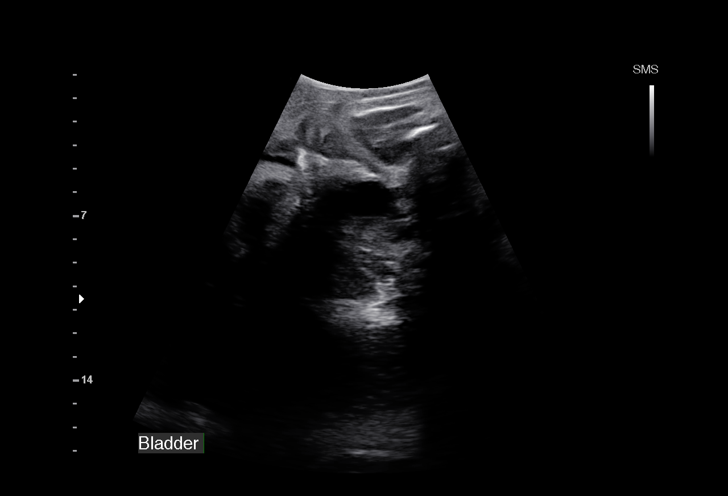
[im 10/12]
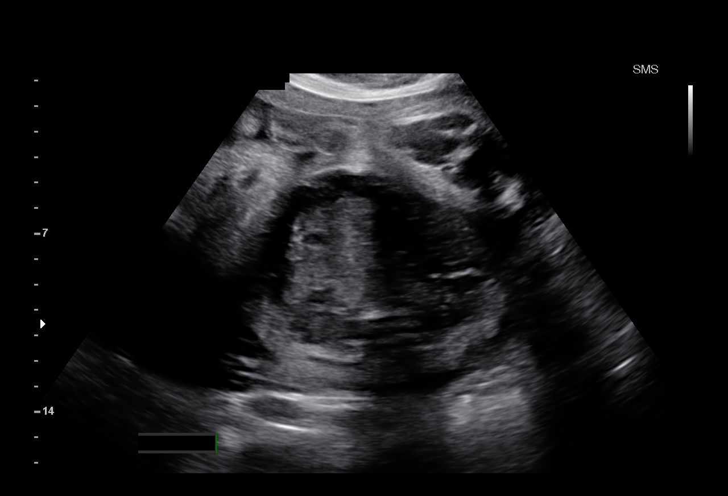
[im 11/12]
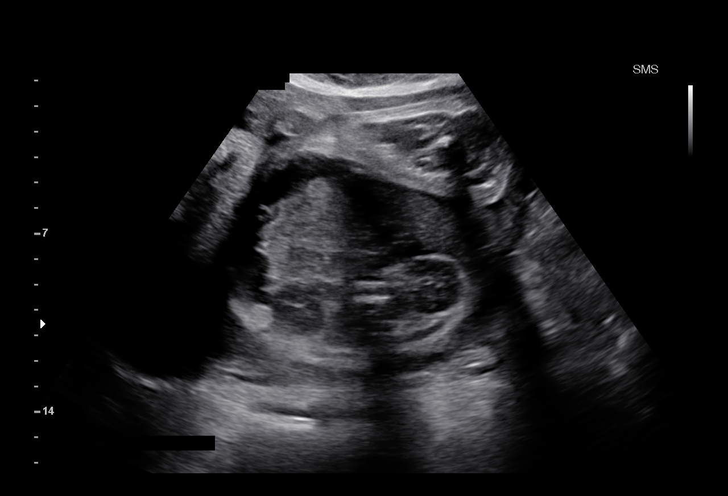
[im 12/12]
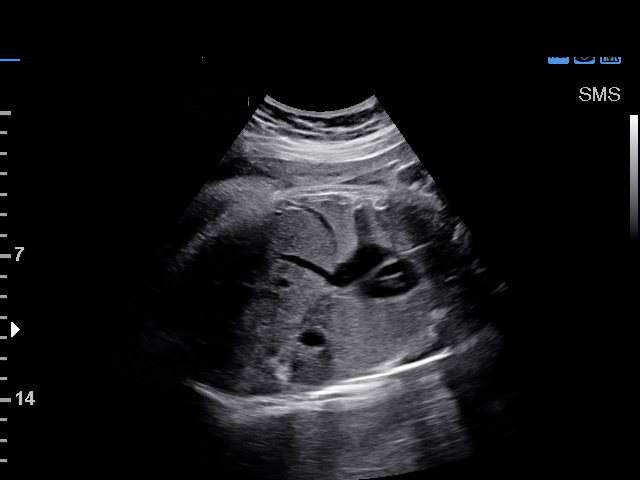

[12 of 12 positions shown; findings below may reference images not displayed]

OBSTETRICS REPORT
(Signed Final 11/19/2014 [DATE])

Name:       RELINDAS AUAD                      Visit  11/17/2014 [DATE]
Date:

Service(s) Provided

[HOSPITAL]                                          76815.0
Indications

Determine fetal presentation using ultrasound          Z36
36 weeks gestation of pregnancy
Advanced maternal age multigravida 35+, third
trimester
Pre-eclampsia
Fetal Evaluation

Num Of             1
Fetuses:
Fetal Heart        139                          bpm
Rate:
Cardiac Activity:  Observed
Presentation:      Cephalic
Placenta:          Fundal, above cervical os

Amniotic Fluid
AFI FV:      Subjectively within normal limits
AFI Sum:     11.31    cm      32  %Tile     Larg Pckt:    4.75   cm
RUQ:   2.86    cm    RLQ:   3.7     cm   LUQ:    0       cm   LLQ:    4.75   cm
Gestational Age

Clinical EDD:  36w 3d                                         EDD:   12/12/14
Best:          36w 3d    Det. By:   Clinical EDD              EDD:   12/12/14
Anatomy

Stomach:          Appears normal,        Bladder:           Appears normal
left sided
Kidneys:          Appear normal
Cervix Uterus Adnexa

Cervix:       No adaquately visualized
Comments

Breathing and movement noted. Port exam in Antenatal.
Impression

Single living intrauterine pregnancy at  25w2d
Limited ultrasound performed to determine presentation
Cephalic Simental
Fetal breathing and movement noted
Normal Amniotic fluid
Recommendations

Follow-up ultrasounds as clinically indicated.
